# Patient Record
Sex: Male | Born: 1995 | Race: White | Hispanic: No | Marital: Single | Smoking: Current every day smoker
Health system: Southern US, Community
[De-identification: ages and names within clinical notes are randomized; demographics above are authoritative.]

## PROBLEM LIST (undated history)

## (undated) DIAGNOSIS — J45909 Unspecified asthma, uncomplicated: Secondary | ICD-10-CM

## (undated) DIAGNOSIS — F319 Bipolar disorder, unspecified: Secondary | ICD-10-CM

## (undated) DIAGNOSIS — F1910 Other psychoactive substance abuse, uncomplicated: Secondary | ICD-10-CM

## (undated) DIAGNOSIS — F909 Attention-deficit hyperactivity disorder, unspecified type: Secondary | ICD-10-CM

## (undated) HISTORY — DX: Unspecified asthma, uncomplicated: J45.909

## (undated) HISTORY — PX: ABDOMINAL SURGERY: SHX537

## (undated) HISTORY — DX: Attention-deficit hyperactivity disorder, unspecified type: F90.9

## (undated) HISTORY — PX: WISDOM TOOTH EXTRACTION: SHX21

## (undated) HISTORY — PX: OTHER SURGICAL HISTORY: SHX169

## (undated) HISTORY — DX: Other psychoactive substance abuse, uncomplicated: F19.10

## (undated) HISTORY — DX: Bipolar disorder, unspecified: F31.9

## (undated) HISTORY — PX: TONSILLECTOMY: SUR1361

---
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
## 2017-04-27 ENCOUNTER — Telehealth: Payer: Self-pay | Admitting: General Practice

## 2017-04-27 NOTE — Telephone Encounter (Signed)
See below crm can I work pt in  In next 30 min appointment   Copied from CRM [phone]. Topic: Appointment Scheduling - Scheduling Inquiry for Clinic >> Apr 27, 2017  8:00 AM Oneal GroutSebastian, Jennifer S wrote: Reason for CRM: Requesting to establish care w/ Mayra ReelKate Clark, needing to be seen asap for pain in private area. [employer] Please advise

## 2017-04-27 NOTE — Telephone Encounter (Signed)
Grandma aware of appointment Appointment 5/6 They will take pt to urgent care 4/26

## 2017-04-27 NOTE — Telephone Encounter (Signed)
Noted  

## 2017-04-27 NOTE — Telephone Encounter (Signed)
Fine with me

## 2017-05-08 ENCOUNTER — Ambulatory Visit: Payer: 59 | Admitting: Primary Care

## 2017-05-08 ENCOUNTER — Encounter: Payer: Self-pay | Admitting: Primary Care

## 2017-05-08 VITALS — BP 122/82 | HR 61 | Temp 97.8°F | Ht 72.0 in | Wt 199.5 lb

## 2017-05-08 DIAGNOSIS — F909 Attention-deficit hyperactivity disorder, unspecified type: Secondary | ICD-10-CM

## 2017-05-08 DIAGNOSIS — F329 Major depressive disorder, single episode, unspecified: Secondary | ICD-10-CM | POA: Diagnosis not present

## 2017-05-08 DIAGNOSIS — F1994 Other psychoactive substance use, unspecified with psychoactive substance-induced mood disorder: Secondary | ICD-10-CM

## 2017-05-08 DIAGNOSIS — F19929 Other psychoactive substance use, unspecified with intoxication, unspecified: Secondary | ICD-10-CM

## 2017-05-08 NOTE — Assessment & Plan Note (Signed)
Evaluated and treated at [facility] in May 2018. Endorses no substance abuse since. Appears well, mood stable.

## 2017-05-08 NOTE — Progress Notes (Signed)
Subjective:    Patient ID: Stephen Haynes, male    DOB: 12-18-1995, 22 y.o.   MRN: [id number]  HPI  Ms. Goens is a 22 year old male who presents today to establish care and discuss the problems mentioned below. Will obtain old records.  1) ADHD: Endorses a diagnosis during younger years, no formal testing recently. Previously managed on Strattera which appears to have been initiated in May 2018 during his hospital admission. He endorses that he was initiated on Adderall 6 months ago from his prior PCP, he's not sure about the dose. He's not taking his Adderall daily as he's trying to stretch his remaining tablets. He's currently unemployed and is not in school.   2) Substance Abuse: IVC petition that was initiated by mother. Initially brought to [facility] ED and then admitted to [facility] on 05/05/16 through 05/09/16 for substance abuse, drug intoxication, and bipolar disorder. He was threatening suicidal/homicidal thoughts during drug intoxication which prompted family to bring him in for treatment.   During his hospital stay he was initiated on atomoxetine 40 mg for attention disorder, hydroxyzine 25 mg PRN anxiety, mirtazapine for sleep/depression. He was discharged on 05/09/16 with recommendations for psychiatry follow up.    He stopped taking all of his medications about 1-2 months after discharge as he didn't feel as though they were helping. He was switched to Adderall from Strattera by his PCP about 6 months ago.   3) Emergency Department Follow Up: Presented to [facility] last week for a 4 week history of bulging vein with tenderness to the shaft of the penis. He underwent lab testing and is unsure of the results. He was prescribed Tramadol and "anti-inflammatory" for pain. He continues to experience an enlarged vein with tenderness.   He denies pain with urination, swelling, penile discharge, testicular tenderness/swelling. He's not been taking Tramadol  or the anti-inflammatory.   Review of Systems  Constitutional: Negative for fever.  Genitourinary: Negative for discharge, dysuria, frequency, genital sores, penile swelling and testicular pain.       Swelling to shaft of penis  Psychiatric/Behavioral:       Endorses history of ADHD, requesting refills of Adderall       Past Medical History:  Diagnosis Date  . ADHD   . Asthma   . Bipolar disorder (HCC)   . Substance abuse (HCC)      Social History   Socioeconomic History  . Marital status: Single    Spouse name: Not on file  . Number of children: Not on file  . Years of education: Not on file  . Highest education level: Not on file  Occupational History  . Not on file  Social Needs  . Financial resource strain: Not on file  . Food insecurity:    Worry: Not on file    Inability: Not on file  . Transportation needs:    Medical: Not on file    Non-medical: Not on file  Tobacco Use  . Smoking status: Current Every Day Smoker  . Smokeless tobacco: Never Used  Substance and Sexual Activity  . Alcohol use: No  . Drug use: Yes    Types: Marijuana    Comment: last use 3 days ago  . Sexual activity: Not on file  Lifestyle  . Physical activity:    Days per week: Not on file    Minutes per session: Not on file  . Stress: Not on file  Relationships  . Social  connections:    Talks on phone: Not on file    Gets together: Not on file    Attends religious service: Not on file    Active member of club or organization: Not on file    Attends meetings of clubs or organizations: Not on file    Relationship status: Not on file  . Intimate partner violence:    Fear of current or ex partner: Not on file    Emotionally abused: Not on file    Physically abused: Not on file    Forced sexual activity: Not on file  Other Topics Concern  . Not on file  Social History Narrative   Single.    No children.   Un-employed.    Enjoys Publishing copy.     Past Surgical History:    Procedure Laterality Date  . ABDOMINAL SURGERY     infancy  . addenoidectomy    . right arm surgery    . TONSILLECTOMY    . WISDOM TOOTH EXTRACTION      Family History  Problem Relation Age of Onset  . COPD Mother   . Gout Father     No Known Allergies  Current Outpatient Medications on File Prior to Visit  Medication Sig Dispense Refill  . amphetamine-dextroamphetamine (ADDERALL) 20 MG tablet Take 20 mg by mouth daily.     No current facility-administered medications on file prior to visit.     BP 122/82   Pulse 61   Temp 97.8 F (36.6 C) (Oral)   Ht 6' (1.829 m)   Wt 199 lb 8 oz (90.5 kg)   SpO2 98%   BMI 27.06 kg/m    Objective:   Physical Exam  Constitutional: He is oriented to person, place, and time. He appears well-nourished.  Neck: Neck supple.  Cardiovascular: Normal rate and regular rhythm.  Pulmonary/Chest: Effort normal and breath sounds normal. He has no wheezes. He has no rales.  Genitourinary: Testes normal and penis normal. Circumcised.     Genitourinary Comments: Skin appears normal. Site of concern is show in picture. No lesions, erythema, engorged veins, discharge. Chaperone present.   Neurological: He is alert and oriented to person, place, and time.  Skin: Skin is warm and dry.  Psychiatric: He has a normal mood and affect.          Assessment & Plan:  Penile Inflammation:  Endorses 4 week history. Exam today without obvious deformity or issue.  Recommended a 5-7 day course of anti-inflammatories.  He will update if symptoms persist, consider Urology consult.   Doreene Nest, NP

## 2017-05-08 NOTE — Patient Instructions (Signed)
You will be contacted regarding your referral to psychiatry.  Please let us know if you have not been contacted within one week.   Start Ibuprofen 600 mg three times daily for 5-7 days penile inflammation.  Please notify me if no improvement in 1 week.  It was a pleasure to meet you today! Please don't hesitate to call or message me with any questions. Welcome to [facility]!

## 2017-05-08 NOTE — Assessment & Plan Note (Signed)
Endorses diagnosis since childhood. Called pharmacy and patient is prescribed Adderall 20 mg. Given history of substance abuse and lack of patient knowledge of prescription, will send to psychiatry for formal testing and treatment.  Would prefer to avoid stimulants in this patient. Did not refill Adderall today. Patient verbalized understanding.

## 2017-05-12 ENCOUNTER — Other Ambulatory Visit: Payer: Self-pay | Admitting: Primary Care

## 2017-05-12 DIAGNOSIS — F900 Attention-deficit hyperactivity disorder, predominantly inattentive type: Secondary | ICD-10-CM

## 2017-05-18 ENCOUNTER — Encounter: Payer: Self-pay | Admitting: Primary Care

## 2018-04-19 ENCOUNTER — Encounter: Payer: Self-pay | Admitting: Primary Care

## 2018-04-19 ENCOUNTER — Ambulatory Visit (INDEPENDENT_AMBULATORY_CARE_PROVIDER_SITE_OTHER): Payer: 59 | Admitting: Primary Care

## 2018-04-19 DIAGNOSIS — F32A Depression, unspecified: Secondary | ICD-10-CM | POA: Insufficient documentation

## 2018-04-19 DIAGNOSIS — F909 Attention-deficit hyperactivity disorder, unspecified type: Secondary | ICD-10-CM | POA: Diagnosis not present

## 2018-04-19 DIAGNOSIS — F329 Major depressive disorder, single episode, unspecified: Secondary | ICD-10-CM | POA: Diagnosis not present

## 2018-04-19 DIAGNOSIS — F419 Anxiety disorder, unspecified: Secondary | ICD-10-CM

## 2018-04-19 MED ORDER — BUPROPION HCL ER (SR) 100 MG PO TB12: 100.0000 mg | ORAL_TABLET | Freq: Two times a day (BID) | ORAL | 1 refills | Status: DC

## 2018-04-19 NOTE — Assessment & Plan Note (Signed)
Symptoms today representative of anxiety, depression, and ADHD.  Given history of substance abuse we will avoid stimulants. GAD 7 score of 15 and PHQ 9 score of 20.  Rx for Wellbutrin SR 100 mg provided for treatment of all symptoms, including ADHD. Discussed to take 100 mg once daily for 3-4 days, then increase to 1 tablet BID thereafter. We also discussed potential side effects.  Referral placed for psychiatry evaluation as well.  He will call if he has any questions/concerns/problems.

## 2018-04-19 NOTE — Patient Instructions (Signed)
Start bupropion SR 100 mg tablets for anxiety/depression/ADHD. Take 1 tablet by mouth once daily for 3-4 days, then 1 tablet twice daily thereafter.  You will be contacted regarding your referral to psychiatry.  Please let us know if you have not been contacted within one week.   Please don't hesitate to call me if you have any questions.  It was nice to see you! Mayra Reel, NP-C

## 2018-04-19 NOTE — Assessment & Plan Note (Signed)
Symptoms today representative of anxiety, depression, and ADHD.  Given history of substance abuse we will avoid stimulants. GAD 7 score of 15 and PHQ 9 score of 20.  Rx for Wellbutrin SR 100 mg provided for treatment of all symptoms, including ADHD. Discussed to take 100 mg once daily for 3-4 days, then increase to 1 tablet BID thereafter. We also discussed potential side effects.  Referral placed for psychiatry evaluation as well.  He will call if he has any questions/concerns/problems.  

## 2018-04-19 NOTE — Progress Notes (Signed)
Subjective:    Patient ID: Stephen Haynes, male    DOB: 1995-03-12, 23 y.o.   MRN: [id number]  HPI  Virtual Visit via Video Note  I connected with Stephen Haynes on 04/19/18 at 11:00 AM EDT by a video enabled telemedicine application and verified that I am speaking with the correct person using two identifiers.   I discussed the limitations of evaluation and management by telemedicine and the availability of in person appointments. The patient expressed understanding and agreed to proceed. He is at home, I am in the office.  We attempted a video visit but the video failed. We completed the rest of our visit via phone.  History of Present Illness:  Stephen Haynes is a 23 year old male with a history of opiod use disorder, hallucinogen abuse, cocaine disorder, cannabis use disorder, ADHD who presents today to discuss ADHD.  He was last evaluated for this in May of 2019. Given his history of substance abuse I felt it to be most appropriate for him to see psychiatry. He was referred and never followed through.   Today he endorses that he could not afford to go psychiatry at the time due to no occupation. He is ready to be referred again. Symptoms include feeling tired, feels irritable, difficulty focusing, difficulty juggling tasks. He has a friend his age that was recently diagnosed with terminal brain cancer. PHQ 9 score of 20 and GAD 7 score of 15 today.   Observations/Objective:  Alert and oriented. Appears well. No distress. Mood appropriate.  Assessment and Plan:  Symptoms today representative of anxiety, depression, and ADHD.  Given history of substance abuse we will avoid stimulants. GAD 7 score of 15 and PHQ 9 score of 20.  Rx for Wellbutrin SR 100 mg provided for treatment of all symptoms, including ADHD. Discussed to take 100 mg once daily for 3-4 days, then increase to 1 tablet BID thereafter. We also discussed potential side effects.  Referral placed for psychiatry  evaluation as well.  He will call if he has any questions/concerns/problems.  Follow Up Instructions:  Start bupropion SR 100 mg tablets for anxiety/depression/ADHD. Take 1 tablet by mouth once daily for 3-4 days, then 1 tablet twice daily thereafter.  You will be contacted regarding your referral to psychiatry.  Please let us know if you have not been contacted within one week.   Please don't hesitate to call me if you have any questions.  It was nice to see you! Mayra ReelKate Irwin Toran, NP-C    I discussed the assessment and treatment plan with the patient. The patient was provided an opportunity to ask questions and all were answered. The patient agreed with the plan and demonstrated an understanding of the instructions.   The patient was advised to call back or seek an in-person evaluation if the symptoms worsen or if the condition fails to improve as anticipated.    Doreene NestKatherine K Wayden Schwertner, NP    Review of Systems  Respiratory: Negative for shortness of breath.   Cardiovascular: Negative for chest pain.  Psychiatric/Behavioral: Positive for sleep disturbance. Negative for suicidal ideas. The patient is nervous/anxious.        See HPI       Past Medical History:  Diagnosis Date  . ADHD   . Asthma   . Bipolar disorder (HCC)   . Substance abuse (HCC)      Social History   Socioeconomic History  . Marital status: Single    Spouse name: Not  on file  . Number of children: Not on file  . Years of education: Not on file  . Highest education level: Not on file  Occupational History  . Not on file  Social Needs  . Financial resource strain: Not on file  . Food insecurity:    Worry: Not on file    Inability: Not on file  . Transportation needs:    Medical: Not on file    Non-medical: Not on file  Tobacco Use  . Smoking status: Current Every Day Smoker  . Smokeless tobacco: Never Used  Substance and Sexual Activity  . Alcohol use: No  . Drug use: Yes    Types: Marijuana     Comment: last use 3 days ago  . Sexual activity: Not on file  Lifestyle  . Physical activity:    Days per week: Not on file    Minutes per session: Not on file  . Stress: Not on file  Relationships  . Social connections:    Talks on phone: Not on file    Gets together: Not on file    Attends religious service: Not on file    Active member of club or organization: Not on file    Attends meetings of clubs or organizations: Not on file    Relationship status: Not on file  . Intimate partner violence:    Fear of current or ex partner: Not on file    Emotionally abused: Not on file    Physically abused: Not on file    Forced sexual activity: Not on file  Other Topics Concern  . Not on file  Social History Narrative   Single.    No children.   Un-employed.    Enjoys Publishing copy.     Past Surgical History:  Procedure Laterality Date  . ABDOMINAL SURGERY     infancy  . addenoidectomy    . right arm surgery    . TONSILLECTOMY    . WISDOM TOOTH EXTRACTION      Family History  Problem Relation Age of Onset  . COPD Mother   . Gout Father     No Known Allergies  No current outpatient medications on file prior to visit.   No current facility-administered medications on file prior to visit.     There were no vitals taken for this visit.   Objective:   Physical Exam  Constitutional: He is oriented to person, place, and time. He appears well-nourished.  Respiratory: Effort normal.  Neurological: He is alert and oriented to person, place, and time.  Psychiatric: He has a normal mood and affect.           Assessment & Plan:

## 2019-12-25 ENCOUNTER — Other Ambulatory Visit: Payer: Self-pay

## 2019-12-25 ENCOUNTER — Encounter: Payer: 59 | Admitting: Family Medicine

## 2019-12-25 NOTE — Progress Notes (Signed)
This encounter was created in error - please disregard.

## 2020-03-03 ENCOUNTER — Encounter (HOSPITAL_COMMUNITY): Payer: Self-pay | Admitting: Emergency Medicine

## 2020-03-03 ENCOUNTER — Emergency Department (HOSPITAL_COMMUNITY)
Admission: EM | Admit: 2020-03-03 | Discharge: 2020-03-04 | Disposition: A | Discharge status: Home / Self Care | Payer: No Typology Code available for payment source | Attending: Emergency Medicine | Admitting: Emergency Medicine

## 2020-03-03 DIAGNOSIS — J45909 Unspecified asthma, uncomplicated: Secondary | ICD-10-CM | POA: Insufficient documentation

## 2020-03-03 DIAGNOSIS — S90512A Abrasion, left ankle, initial encounter: Secondary | ICD-10-CM | POA: Diagnosis not present

## 2020-03-03 DIAGNOSIS — W228XXA Striking against or struck by other objects, initial encounter: Secondary | ICD-10-CM | POA: Diagnosis not present

## 2020-03-03 DIAGNOSIS — M25572 Pain in left ankle and joints of left foot: Secondary | ICD-10-CM

## 2020-03-03 DIAGNOSIS — Y990 Civilian activity done for income or pay: Secondary | ICD-10-CM | POA: Insufficient documentation

## 2020-03-03 DIAGNOSIS — F17200 Nicotine dependence, unspecified, uncomplicated: Secondary | ICD-10-CM | POA: Diagnosis not present

## 2020-03-03 DIAGNOSIS — S99912A Unspecified injury of left ankle, initial encounter: Secondary | ICD-10-CM | POA: Diagnosis present

## 2020-03-03 NOTE — ED Triage Notes (Addendum)
Pt states he hurt his left ankle at work, where equipment rolled on his left ankle at [employer]

## 2020-03-04 ENCOUNTER — Emergency Department (HOSPITAL_COMMUNITY): Payer: No Typology Code available for payment source

## 2020-03-04 NOTE — ED Notes (Signed)
Ortho paged. 

## 2020-03-04 NOTE — Progress Notes (Signed)
Orthopedic Tech Progress Note Patient Details:  MATHEU PLOEGER 1995-09-24 [id number]  Ortho Devices Type of Ortho Device: ASO,Crutches Ortho Device/Splint Location: Left Lower Extremity Ortho Device/Splint Interventions: Ordered,Adjustment,Application   Post Interventions Patient Tolerated: Well Instructions Provided: Adjustment of device,Care of device,Poper ambulation with device   Gerald Stabs 03/04/2020, 6:32 AM

## 2020-03-04 NOTE — ED Provider Notes (Signed)
[facility] EMERGENCY DEPARTMENT Provider Note   CSN: [id number] Arrival date & time: 03/03/20  2339     History Chief Complaint  Patient presents with  . Ankle Pain    Stephen GAUTHREAUX is a 25 y.o. male.  The history is provided by the patient and medical records.  Ankle Pain   25 y.o. M with hx of ADHD, asthma, bipolar disorder, substance abuse, presenting to the ED for left ankle pain.  Patient states a rolling track for pallets ran over his ankle at work.  Small abrasion but no open wound/laceration.  States he did walk afterwards but was painful.  Denies prior ankle injuries or surgeries.  No meds PTA.  Past Medical History:  Diagnosis Date  . ADHD   . Asthma   . Bipolar disorder (HCC)   . Substance abuse [facility])     Patient Active Problem List   Diagnosis Date Noted  . Anxiety and depression 04/19/2018  . Attention deficit hyperactivity disorder (ADHD) 05/08/2017  . Cannabis use disorder, moderate, dependence (HCC) 05/09/2016  . Opioid use disorder, moderate, in sustained remission (HCC) 05/09/2016  . 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 Cocaine use disorder, moderate, in sustained remission (HCC) 05/09/2016  . Substance or medication-induced bipolar and related disorder with onset during intoxication (HCC) 05/09/2016    Past Surgical History:  Procedure Laterality Date  . ABDOMINAL SURGERY     infancy  . addenoidectomy    . right arm surgery    . TONSILLECTOMY    . WISDOM TOOTH EXTRACTION         Family History  Problem Relation Age of Onset  . COPD Mother   . Gout Father     Social History   Tobacco Use  . Smoking status: Current Every Day Smoker  . Smokeless tobacco: Never Used  Vaping Use  . Vaping Use: Some days  Substance Use Topics  . Alcohol use: No  . Drug use: Yes    Types: Marijuana    Comment: last use 3 days ago    Home Medications Prior to Admission medications   Medication Sig Start Date  End Date Taking? Authorizing Provider  buPROPion (WELLBUTRIN SR) 100 MG 12 hr tablet Take 1 tablet (100 mg total) by mouth 2 (two) times daily. For anxiety and depression. 04/19/18   Doreene Nest, NP    Allergies    Patient has no known allergies.  Review of Systems   Review of Systems  Musculoskeletal: Positive for arthralgias.  All other systems reviewed and are negative.   Physical Exam Updated Vital Signs BP 137/88 (BP Location: Right Arm)   Pulse 68   Temp 98.2 F (36.8 C) (Oral)   Resp 18   Ht 6\' 1"  (1.854 m)   Wt 70.3 kg   SpO2 95%   BMI 20.45 kg/m   Physical Exam Vitals and nursing note reviewed.  Constitutional:      Appearance: He is well-developed and well-nourished.  HENT:     Head: Normocephalic and atraumatic.     Mouth/Throat:     Mouth: Oropharynx is clear and moist.  Eyes:     Extraocular Movements: EOM normal.     Conjunctiva/sclera: Conjunctivae normal.     Pupils: Pupils are equal, round, and reactive to light.  Cardiovascular:     Rate and Rhythm: Normal rate and regular rhythm.     Heart sounds: Normal heart sounds.  Pulmonary:  Effort: Pulmonary effort is normal. No respiratory distress.     Breath sounds: Normal breath sounds. No rhonchi.  Abdominal:     General: Bowel sounds are normal.     Palpations: Abdomen is soft.  Musculoskeletal:        General: Normal range of motion.     Cervical back: Normal range of motion.     Comments: Left ankle with small abrasion noted to the medial aspect, no open wound/laceration, mild swelling noted but no acute bony deformity, pain elicited with ROM, DP pulse intact  Skin:    General: Skin is warm and dry.  Neurological:     Mental Status: He is alert and oriented to person, place, and time.  Psychiatric:        Mood and Affect: Mood and affect normal.     ED Results / Procedures / Treatments   Labs (all labs ordered are listed, but only abnormal results are displayed) Labs Reviewed -  No data to display  EKG None  Radiology DG Ankle Complete Left  Result Date: 03/04/2020 CLINICAL DATA:  Ankle pain EXAM: LEFT ANKLE COMPLETE - 3+ VIEW COMPARISON:  None. FINDINGS: There is no evidence of fracture, dislocation, or joint effusion. There is no evidence of arthropathy or other focal bone abnormality. Soft tissues are unremarkable. IMPRESSION: Negative. Electronically Signed   By: Katherine Mantle M.D.   On: 03/04/2020 00:33    Procedures Procedures   Medications Ordered in ED Medications - No data to display  ED Course  I have reviewed the triage vital signs and the nursing notes.  Pertinent labs & imaging results that were available during my care of the patient were reviewed by me and considered in my medical decision making (see chart for details).    MDM Rules/Calculators/A&P  25 year old male who with left after being hit by piece of equipment at work.  Has a small abrasion but no open wound or laceration.  Mild soft tissue swelling without bony deformity.  Foot is neurovascularly intact.  X-rays negative.  Will place in ASO and give crutches, progress back to full weightbearing as tolerated.  Orthopedic follow-up given.  Return here for any new or acute changes.  Final Clinical Impression(s) / ED Diagnoses Final diagnoses:  Acute left ankle pain    Rx / DC Orders ED Discharge Orders    None       Garlon Hatchet, PA-C 03/04/20 [id number]    Zadie Rhine, MD 03/05/20 [phone]

## 2020-03-04 NOTE — Discharge Instructions (Addendum)
Can use crutches for now, progress back to full weight bearing as tolerated. Can ice and elevate ankle, tylenol or motrin for pain. Can follow-up with orthopedics if any ongoing issues. Return here for new concerns.

## 2021-10-07 ENCOUNTER — Encounter: Payer: Self-pay | Admitting: *Deleted

## 2021-10-07 ENCOUNTER — Emergency Department
Admission: EM | Admit: 2021-10-07 | Discharge: 2021-10-08 | Disposition: A | Discharge status: Home / Self Care | Payer: Self-pay | Attending: Emergency Medicine | Admitting: Emergency Medicine

## 2021-10-07 ENCOUNTER — Other Ambulatory Visit: Payer: Self-pay

## 2021-10-07 ENCOUNTER — Emergency Department: Payer: Self-pay

## 2021-10-07 DIAGNOSIS — R002 Palpitations: Secondary | ICD-10-CM | POA: Insufficient documentation

## 2021-10-07 DIAGNOSIS — R000 Tachycardia, unspecified: Secondary | ICD-10-CM | POA: Insufficient documentation

## 2021-10-07 DIAGNOSIS — F419 Anxiety disorder, unspecified: Secondary | ICD-10-CM | POA: Insufficient documentation

## 2021-10-07 DIAGNOSIS — R079 Chest pain, unspecified: Secondary | ICD-10-CM | POA: Insufficient documentation

## 2021-10-07 DIAGNOSIS — J45909 Unspecified asthma, uncomplicated: Secondary | ICD-10-CM | POA: Insufficient documentation

## 2021-10-07 DIAGNOSIS — H1189 Other specified disorders of conjunctiva: Secondary | ICD-10-CM | POA: Insufficient documentation

## 2021-10-07 MED ORDER — LORAZEPAM 2 MG/ML IJ SOLN
1.0000 mg | Freq: Once | INTRAMUSCULAR | Status: AC
  Administered 2021-10-07: 1 mg via INTRAVENOUS
  Filled 2021-10-07: qty 1

## 2021-10-07 MED ORDER — SODIUM CHLORIDE 0.9 % IV BOLUS
1000.0000 mL | Freq: Once | INTRAVENOUS | Status: AC
  Administered 2021-10-07: 1000 mL via INTRAVENOUS

## 2021-10-07 NOTE — ED Triage Notes (Signed)
Pt states he has chest pain for 1 hour.  No sob.  No n/v/d.  Pt reports feeling numb.  Pt denies drugs or etoh use.  Pt trembling in triage.

## 2021-10-07 NOTE — ED Provider Notes (Signed)
[facility] Provider Note    Event Date/Time   First MD Initiated Contact with Patient 10/07/21 2306     (approximate)   History   Chest Pain   HPI  Stephen Haynes is a 26 y.o. male who presents to the ED from home with a chief complaint of chest pain, palpitations feeling generally numb and anxious.  Symptoms began approximately 1 hour prior to arrival while patient was playing VR.  Endorses marijuana use but not recently.  Denies associated diaphoresis, shortness of breath, nausea/vomiting or dizziness.  Denies recent fever, cough, trauma or travel.  Father at bedside and states there is a family history of panic attacks.     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 Authorizing Provider  LORazepam (ATIVAN) 1 MG tablet Take 1 tablet (1 mg total) by mouth every 8 (eight) hours as needed for anxiety. 10/08/21  Yes Irean Hong, MD  buPROPion (WELLBUTRIN SR) 100 MG 12 hr tablet Take 1 tablet (100 mg total) by mouth 2  (two) times daily. For anxiety and depression. Patient not taking: Reported on 10/08/2021 04/19/18   Doreene Nest, NP     Allergies  Patient has no known allergies.   Family History   Family History  Problem Relation Age of Onset   COPD Mother    Gout Father      Physical Exam  Triage Vital Signs: ED Triage Vitals  Enc Vitals Group     BP 10/07/21 2254 (!) 150/94     Pulse Rate 10/07/21 2254 (!) 156     Resp 10/07/21 2254 20     Temp 10/07/21 2254 99 F (37.2 C)     Temp Source 10/07/21 2254 Oral     SpO2 10/07/21 2254 97 %     Weight 10/07/21 2250 160 lb (72.6 kg)     Height 10/07/21 2250 6' (1.829 m)     Head Circumference --      Peak Flow --      Pain Score 10/07/21 2250 0     Pain Loc --      Pain Edu? --      Excl. in GC? --     Updated Vital Signs: BP 116/74   Pulse 82   Temp 99 F (37.2 C) (Oral)   Resp 15   Ht 6' (1.829 m)   Wt 72.6 kg   SpO2 96%  BMI 21.70 kg/m    General: Awake, moderate distress.  CV:  Tachycardic.  Good peripheral perfusion.  Resp:  Normal effort.  CTA B. Abd:  Nontender.  No distention.  Other:  No thyromegaly.  Bilateral conjunctive eye injected.  Very anxious.   ED Results / Procedures / Treatments  Labs (all labs ordered are listed, but only abnormal results are displayed) Labs Reviewed  BASIC METABOLIC PANEL - Abnormal; Notable for the following components:      Result Value   Glucose, Bld 107 (*)    Anion gap 4 (*)    All other components within normal limits  CBC  TSH  T4, FREE  CK  URINE DRUG SCREEN, QUALITATIVE ([facility] ONLY)  TROPONIN I (HIGH SENSITIVITY)  TROPONIN I (HIGH SENSITIVITY)     EKG  ED ECG REPORT I, Enes Wegener J, the attending physician, personally viewed and interpreted this ECG.   Date: 10/07/2021  EKG Time: 2251  Rate: 149  Rhythm: sinus tachycardia  Axis: Normal  Intervals:none  ST&T Change: Nonspecific    RADIOLOGY I have independently visualized and interpreted  patient's chest x-ray as well as noted the radiology interpretation:  X-ray: No acute cardiopulmonary process  Official radiology report(s): DG Chest 2 View  Result Date: 10/07/2021 CLINICAL DATA:  Chest pain. EXAM: CHEST - 2 VIEW COMPARISON:  None Available. FINDINGS: The heart size and mediastinal contours are within normal limits. Both lungs are clear. The visualized skeletal structures are unremarkable. IMPRESSION: No active cardiopulmonary disease. Electronically Signed   By: Aram Candela M.D.   On: 10/07/2021 23:35     PROCEDURES:  Critical Care performed: No  .1-3 Lead EKG Interpretation  Performed by: Irean Hong, MD Authorized by: Irean Hong, MD     Interpretation: abnormal     ECG rate:  130   ECG rate assessment: tachycardic     Rhythm: sinus tachycardia     Ectopy: none     Conduction: normal   Comments:     Patient placed on cardiac monitor to evaluate for arrhythmias    MEDICATIONS ORDERED IN ED: Medications  LORazepam (ATIVAN) injection 1 mg (1 mg Intravenous Given 10/07/21 2348)  sodium chloride 0.9 % bolus 1,000 mL (1,000 mLs Intravenous New Bag/Given 10/07/21 2349)     IMPRESSION / MDM / ASSESSMENT AND PLAN / ED COURSE  I reviewed the triage vital signs and the nursing notes.                             26 year old male presenting with chest pain, palpitations and anxiety. Differential diagnosis includes, but is not limited to, ACS, aortic dissection, pulmonary embolism, cardiac tamponade, pneumothorax, pneumonia, pericarditis, myocarditis, GI-related causes including esophagitis/gastritis, and musculoskeletal chest wall pain.   I have personally reviewed patient's records and note his last office visit was in June 2022 for right hand swelling at the orthopedics clinic.  Patient's presentation is most consistent with acute presentation with potential threat to life or bodily function.  The patient is on the cardiac monitor to evaluate for evidence  of arrhythmia and/or significant heart rate changes.  We will obtain cardiac panel including thyroid function, chest x-ray.  Administer IV fluid resuscitation and 1mg  IV Ativan for calming.  Will reassess.  Clinical Course as of 10/08/21 0315  Fri Oct 08, 2021  0100 Heart rate 85 after administration of IV Ativan.  Patient appears calmer, resting in no acute distress. [JS]  9604 Repeat troponin unremarkable. Patient feeling significantly improved. Feel patient is safe for discharge home with close follow-up with his PCP.  Will prescribe as needed lorazepam.  Strict return precautions given.  Patient and father verbalized understanding agree with plan of care. [JS]    Clinical Course User Index [JS] Paulette Blanch, MD     FINAL CLINICAL IMPRESSION(S) / ED DIAGNOSES   Final diagnoses:  Nonspecific chest pain  Palpitations  Anxiety     Rx / DC Orders   ED Discharge Orders          Ordered    LORazepam (ATIVAN) 1 MG tablet  Every 8 hours PRN        10/08/21 0236             Note:  This document was prepared using Dragon voice recognition software and may include unintentional dictation errors.   Paulette Blanch, MD 10/08/21 [phone]

## 2021-10-08 LAB — CBC
HCT: 43.7 % (ref 39.0–52.0)
Hemoglobin: 14.8 g/dL (ref 13.0–17.0)
MCH: 29.7 pg (ref 26.0–34.0)
MCHC: 33.9 g/dL (ref 30.0–36.0)
MCV: 87.6 fL (ref 80.0–100.0)
Platelets: 195 10*3/uL (ref 150–400)
RBC: 4.99 MIL/uL (ref 4.22–5.81)
RDW: 12.2 % (ref 11.5–15.5)
WBC: 8.5 10*3/uL (ref 4.0–10.5)
nRBC: 0 % (ref 0.0–0.2)

## 2021-10-08 LAB — BASIC METABOLIC PANEL
Anion gap: 4 — ABNORMAL LOW (ref 5–15)
BUN: 17 mg/dL (ref 6–20)
CO2: 28 mmol/L (ref 22–32)
Calcium: 9.1 mg/dL (ref 8.9–10.3)
Chloride: 107 mmol/L (ref 98–111)
Creatinine, Ser: 1.01 mg/dL (ref 0.61–1.24)
GFR, Estimated: >60 mL/min (ref >60)
Glucose, Bld: 107 mg/dL — ABNORMAL HIGH (ref 70–99)
Potassium: 3.6 mmol/L (ref 3.5–5.1)
Sodium: 139 mmol/L (ref 135–145)

## 2021-10-08 LAB — CK: Total CK: 149 U/L (ref 49–397)

## 2021-10-08 LAB — TROPONIN I (HIGH SENSITIVITY)
Troponin I (High Sensitivity): 16 ng/L (ref <18)
Troponin I (High Sensitivity): 6 ng/L (ref <18)

## 2021-10-08 LAB — TSH: TSH: 0.769 u[IU]/mL (ref 0.350–4.500)

## 2021-10-08 LAB — T4, FREE: Free T4: 0.69 ng/dL (ref 0.61–1.12)

## 2021-10-08 MED ORDER — LORAZEPAM 1 MG PO TABS: 1.0000 mg | ORAL_TABLET | Freq: Three times a day (TID) | ORAL | 0 refills | Status: AC | PRN

## 2021-10-08 NOTE — Discharge Instructions (Signed)
You may take Lorazepam as needed for anxiety.  Reduce caffeine intake whenever possible.  Return to the ER for worsening symptoms, persistent vomiting, difficulty breathing or other concerns.

## 2021-10-29 ENCOUNTER — Encounter: Payer: Self-pay | Admitting: Primary Care

## 2021-10-29 ENCOUNTER — Ambulatory Visit (INDEPENDENT_AMBULATORY_CARE_PROVIDER_SITE_OTHER): Payer: Self-pay | Admitting: Primary Care

## 2021-10-29 VITALS — BP 114/64 | HR 63 | Temp 98.2°F | Resp 16 | Ht 72.0 in | Wt 178.1 lb

## 2021-10-29 DIAGNOSIS — F419 Anxiety disorder, unspecified: Secondary | ICD-10-CM

## 2021-10-29 DIAGNOSIS — F32A Depression, unspecified: Secondary | ICD-10-CM

## 2021-10-29 DIAGNOSIS — R21 Rash and other nonspecific skin eruption: Secondary | ICD-10-CM

## 2021-10-29 DIAGNOSIS — Z113 Encounter for screening for infections with a predominantly sexual mode of transmission: Secondary | ICD-10-CM

## 2021-10-29 MED ORDER — HYDROXYZINE HCL 10 MG PO TABS: 10.0000 mg | ORAL_TABLET | Freq: Two times a day (BID) | ORAL | 0 refills | Status: AC | PRN

## 2021-10-29 MED ORDER — SERTRALINE HCL 50 MG PO TABS: 50.0000 mg | ORAL_TABLET | Freq: Every day | ORAL | 0 refills | Status: AC

## 2021-10-29 NOTE — Patient Instructions (Addendum)
Stop by the lab prior to leaving today. I will notify you of your results once received.   Stop shaving as discussed.   Start sertraline (Zoloft) 50 mg for anxiety and depression. Take 1/2 tablet by mouth once daily for about one week, then increase to 1 full tablet thereafter.   Please schedule a follow up visit for 6 weeks for follow up of anxiety/depression.  It was a pleasure to see you today!

## 2021-10-29 NOTE — Progress Notes (Signed)
Subjective:    Patient ID: Stephen Haynes, male    DOB: [birth year], 26 y.o.   MRN: [id number]  HPI  Stephen Haynes is a very pleasant 26 y.o. male with a history of opioid use disorder, cocaine use disorder, cannabis use disorder, ADHD, anxiety and depression who presents today to re-establish care and to discuss anxiety and panic attacks. He would also like to discuss rash.  1) Anxiety and Depression:   He has not been seen in our practice since April 2020.  Evaluated at [facility] ED on 10/07/2021 for symptoms of chest pain, palpitations, anxiety.  Work-up in the ED was negative for acute cardiac cause.  Symptoms were suspected to be secondary to anxiety.  He was provided with IV fluids and IV Ativan with improvement.  He was discharged home later that next morning with a prescription for lorazepam 1 mg daily as needed.  Chronic symptoms of anxiety which include worrying, feeling overwhelmed, restlessness, and irritability. Symptoms of anxiety occur every other day. He's had three panic attacks since October 2023.   Previously prescribed bupropion SR 100 mg twice daily for anxiety, depression, ADHD. He stopped taking this as he began feeling better. He was once managed on Zoloft years ago, doesn't recall how he did on Zoloft.   He's been feeling more stressed and down lately given his mother's diagnosis of cancer.   2) Rash: Whitish spots to the scrotum and penis which have been present for the last year. His spots are itchy and irritating. His spots will wax and wane. He is sexually active, most of the time he wears a condom.   He denies penile discharge, dysuria, hematuria.   Review of Systems  Respiratory:  Negative for shortness of breath.   Cardiovascular:  Negative for chest pain.  Skin:  Positive for rash.  Psychiatric/Behavioral:  The patient is nervous/anxious.          Past Medical History:  Diagnosis Date   ADHD    Asthma    Bipolar disorder ([place])    Substance abuse  ([place])     Social History   Socioeconomic History   Marital status: Single    Spouse name: Not on file   Number of children: Not on file   Years of education: Not on file   Highest education level: Not on file  Occupational History   Not on file  Tobacco Use   Smoking status: Every Day   Smokeless tobacco: Never  Vaping Use   Vaping Use: Some days  Substance and Sexual Activity   Alcohol use: Yes   Drug use: Yes    Types: Marijuana    Comment: last use 3 days ago   Sexual activity: Not on file  Other Topics Concern   Not on file  Social History Narrative   Single.    No children.   Un-employed.    Enjoys Proofreader.    Social Determinants of Health   Financial Resource Strain: Not on file  Food Insecurity: Not on file  Transportation Needs: Not on file  Physical Activity: Not on file  Stress: Not on file  Social Connections: Not on file  Intimate Partner Violence: Not on file    Past Surgical History:  Procedure Laterality Date   ABDOMINAL SURGERY     infancy   addenoidectomy     right arm surgery     TONSILLECTOMY     WISDOM TOOTH EXTRACTION      Family History  Problem Relation Age of Onset   COPD Mother    Gout Father     No Known Allergies  Current Outpatient Medications on File Prior to Visit  Medication Sig Dispense Refill   LORazepam (ATIVAN) 1 MG tablet Take 1 tablet (1 mg total) by mouth every 8 (eight) hours as needed for anxiety. 15 tablet 0   No current facility-administered medications on file prior to visit.    BP 114/64   Pulse 63   Temp 98.2 F (36.8 C)   Resp 16   Ht 6' (1.829 m)   Wt 178 lb 2 oz (80.8 kg)   SpO2 98%   BMI 24.16 kg/m  Objective:   Physical Exam Cardiovascular:     Rate and Rhythm: Normal rate and regular rhythm.  Pulmonary:     Effort: Pulmonary effort is normal.     Breath sounds: Normal breath sounds. No wheezing or rales.  Genitourinary:    Penis: No discharge, swelling or lesions.       Testes:        Right: Mass not present.        Left: Mass not present.     Comments: Numerous non engorged hair follicles to scrotum. No rash, no lesions.  Musculoskeletal:     Cervical back: Neck supple.  Skin:    General: Skin is warm and dry.  Neurological:     Mental Status: He is alert and oriented to person, place, and time.           Assessment & Plan:   Problem List Items Addressed This Visit       Musculoskeletal and Integument   Rash and nonspecific skin eruption    Appears to be non infectious/ inflamed hair follicles.  We discussed to stop shaving.  Avoid tight underwear that could capture sweating/moisture.  Checking STD's today. No obvious lesions on exam.        Other   Anxiety and depression - Primary   Relevant Medications   sertraline (ZOLOFT) 50 MG tablet   hydrOXYzine (ATARAX) 10 MG tablet   Other Visit Diagnoses     Screening for STD (sexually transmitted disease)       Relevant Orders   RPR   HIV Antibody (routine testing w rflx)   Herpes Simplex Virus 1 and 2 (IgG), with Reflex to HSV-2 Inhibition   Hepatitis C antibody   C. trachomatis/N. gonorrhoeae RNA   Trichomonas vaginalis, RNA          Pleas Koch, NP

## 2021-10-29 NOTE — Assessment & Plan Note (Signed)
Appears to be non infectious/ inflamed hair follicles.  We discussed to stop shaving.  Avoid tight underwear that could capture sweating/moisture.  Checking STD's today. No obvious lesions on exam.

## 2021-10-31 LAB — RPR: RPR Ser Ql: NONREACTIVE

## 2021-10-31 LAB — HERPES SIMPLEX VIRUS 1 AND 2 (IGG),REFLEX HSV-2 INHIBITION
HAV 1 IGG,TYPE SPECIFIC AB: <0.9 index
HSV 2 IGG,TYPE SPECIFIC AB: <0.9 index

## 2021-10-31 LAB — C. TRACHOMATIS/N. GONORRHOEAE RNA
C. trachomatis RNA, TMA: NOT DETECTED
N. gonorrhoeae RNA, TMA: NOT DETECTED

## 2021-10-31 LAB — HEPATITIS C ANTIBODY: Hepatitis C Ab: NONREACTIVE

## 2021-10-31 LAB — TRICHOMONAS VAGINALIS, PROBE AMP: Trichomonas vaginalis RNA: NOT DETECTED

## 2021-10-31 LAB — HIV ANTIBODY (ROUTINE TESTING W REFLEX): HIV 1&2 Ab, 4th Generation: NONREACTIVE

## 2022-08-05 IMAGING — CR DG ANKLE COMPLETE 3+V*L*
3 series · 3 of 3 positions shown · non-contrast
Comparison: None.

CLINICAL DATA: Ankle pain

EXAM:
LEFT ANKLE COMPLETE - 3+ VIEW

[ankle ap]
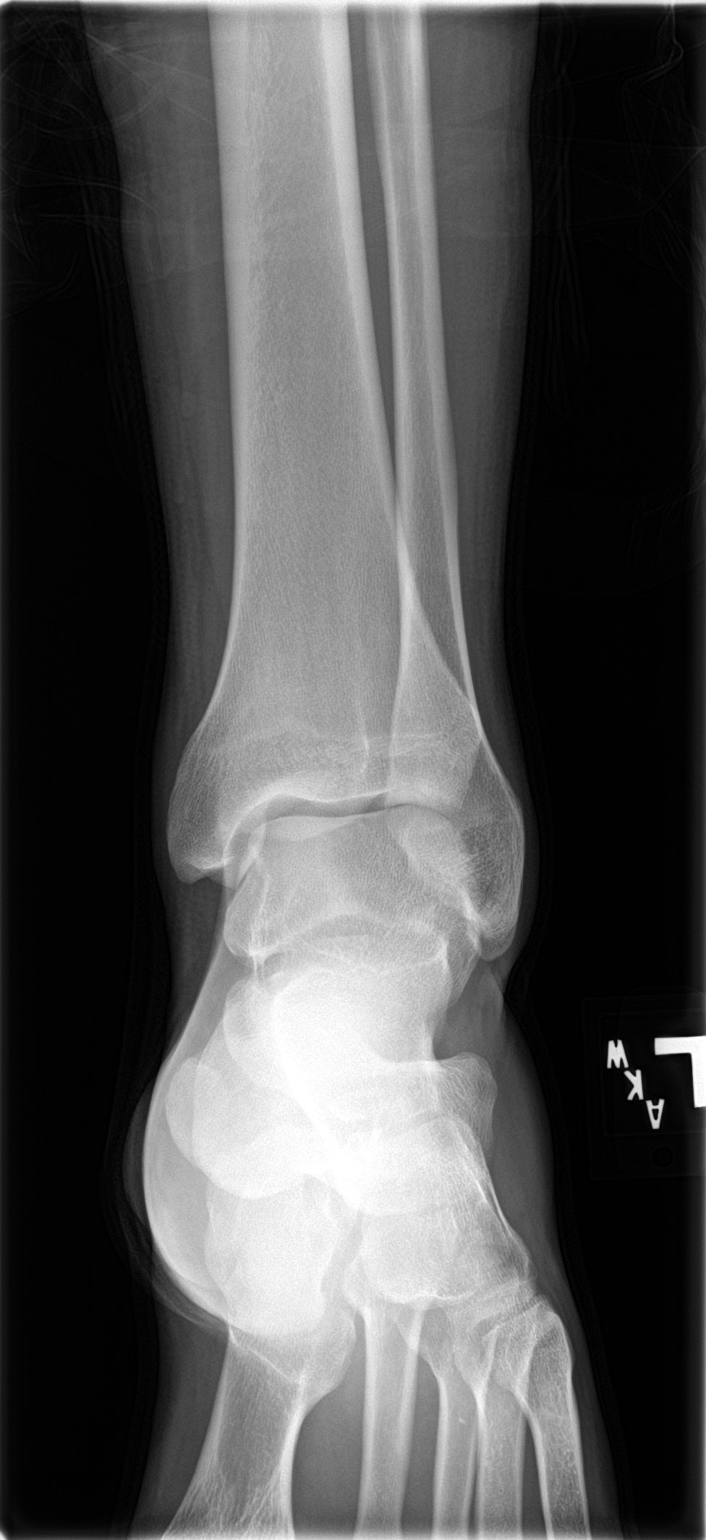

[ankle obl]
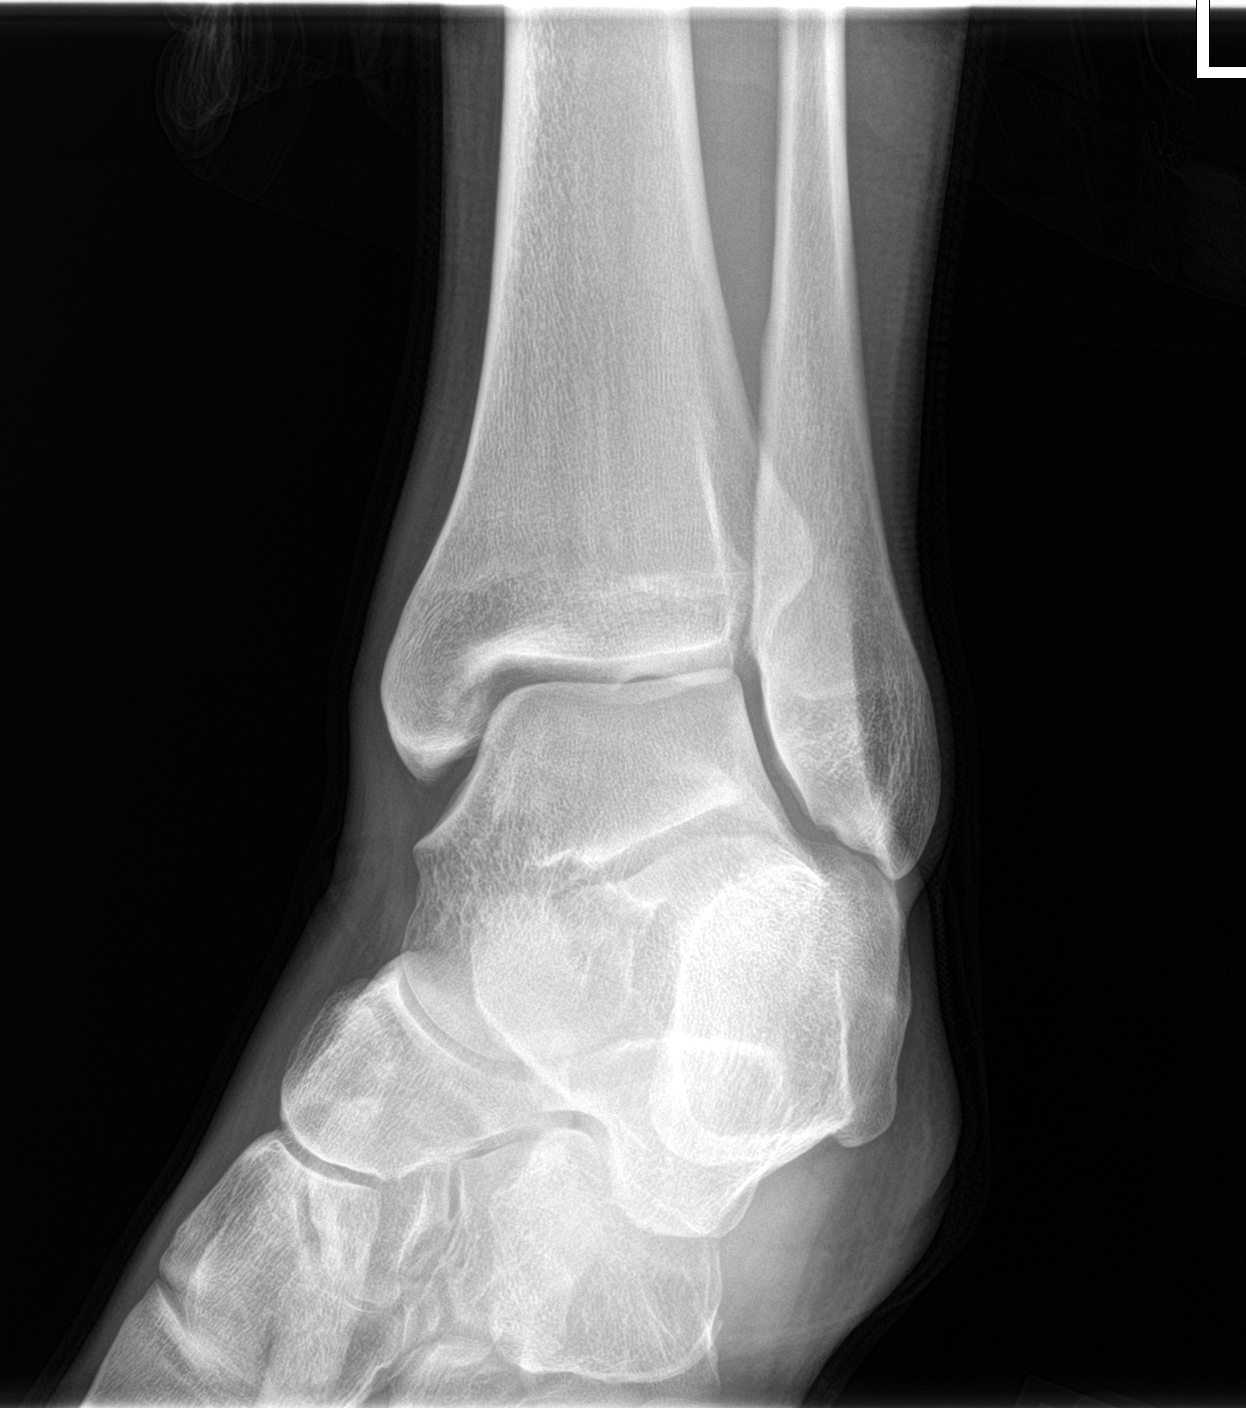

[ankle lat]
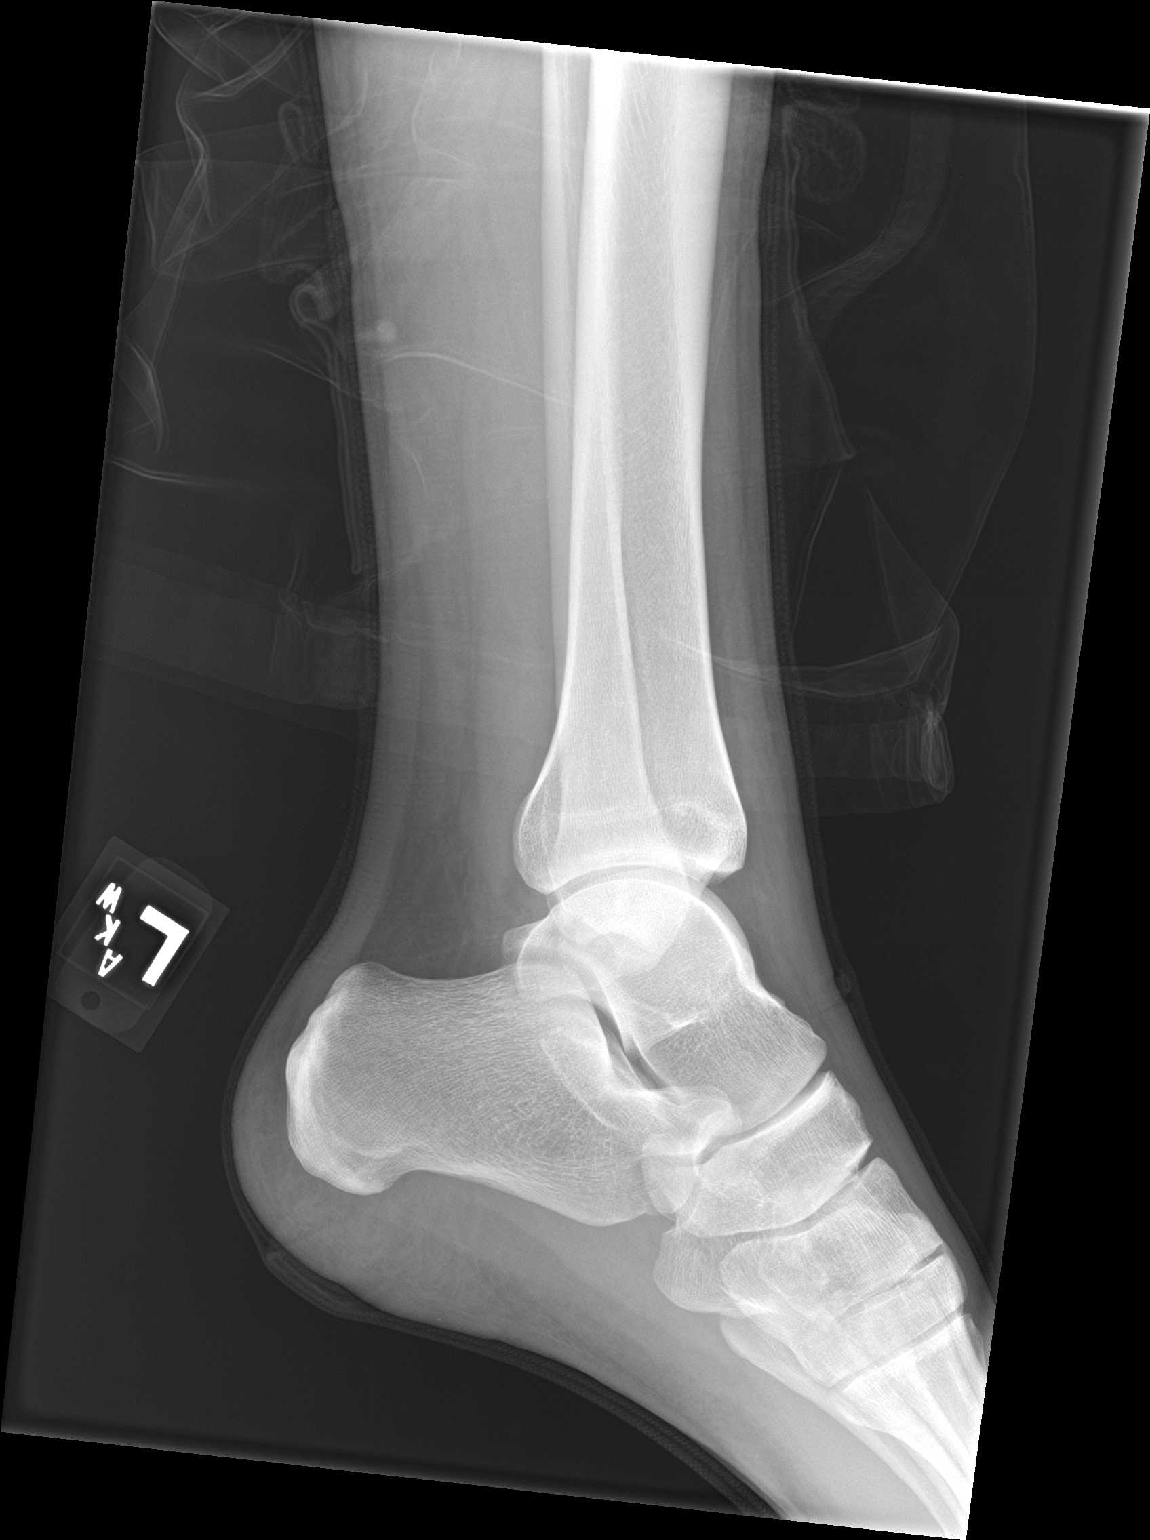

[3 of 3 positions shown; findings below may reference images not displayed]

FINDINGS: There is no evidence of fracture, dislocation, or joint effusion.
There is no evidence of arthropathy or other focal bone abnormality.
Soft tissues are unremarkable.
IMPRESSION: Negative.
